# Patient Record
Sex: Female | Born: 1975 | Race: White | Hispanic: No | Smoking: Former smoker
Health system: Southern US, Community
[De-identification: ages and names within clinical notes are randomized; demographics above are authoritative.]

## PROBLEM LIST (undated history)

## (undated) DIAGNOSIS — B009 Herpesviral infection, unspecified: Secondary | ICD-10-CM

## (undated) HISTORY — PX: ANTERIOR CRUCIATE LIGAMENT REPAIR: SHX115

## (undated) HISTORY — PX: OTHER SURGICAL HISTORY: SHX169

## (undated) HISTORY — PX: AUGMENTATION MAMMAPLASTY: SUR837

---

## 2001-07-23 ENCOUNTER — Ambulatory Visit (HOSPITAL_COMMUNITY): Admission: AD | Admit: 2001-07-23 | Discharge: 2001-07-23 | Payer: Self-pay | Admitting: Obstetrics and Gynecology

## 2002-01-08 ENCOUNTER — Other Ambulatory Visit: Admission: RE | Admit: 2002-01-08 | Discharge: 2002-01-08 | Payer: Self-pay | Admitting: Obstetrics and Gynecology

## 2002-04-05 ENCOUNTER — Ambulatory Visit: Admission: RE | Admit: 2002-04-05 | Discharge: 2002-04-05 | Payer: Self-pay | Admitting: Obstetrics and Gynecology

## 2002-06-16 ENCOUNTER — Inpatient Hospital Stay (HOSPITAL_COMMUNITY): Admission: AD | Admit: 2002-06-16 | Discharge: 2002-06-20 | Payer: Self-pay | Admitting: Obstetrics and Gynecology

## 2002-07-19 ENCOUNTER — Other Ambulatory Visit: Admission: RE | Admit: 2002-07-19 | Discharge: 2002-07-19 | Payer: Self-pay | Admitting: Obstetrics and Gynecology

## 2003-08-16 ENCOUNTER — Other Ambulatory Visit: Admission: RE | Admit: 2003-08-16 | Discharge: 2003-08-16 | Payer: Self-pay | Admitting: Obstetrics and Gynecology

## 2004-09-24 ENCOUNTER — Other Ambulatory Visit: Admission: RE | Admit: 2004-09-24 | Discharge: 2004-09-24 | Payer: Self-pay | Admitting: Obstetrics and Gynecology

## 2005-12-02 ENCOUNTER — Inpatient Hospital Stay (HOSPITAL_COMMUNITY): Admission: AD | Admit: 2005-12-02 | Discharge: 2005-12-03 | Payer: Self-pay | Admitting: Obstetrics and Gynecology

## 2006-01-03 ENCOUNTER — Other Ambulatory Visit: Admission: RE | Admit: 2006-01-03 | Discharge: 2006-01-03 | Payer: Self-pay | Admitting: Obstetrics and Gynecology

## 2006-05-01 ENCOUNTER — Ambulatory Visit (HOSPITAL_COMMUNITY): Admission: RE | Admit: 2006-05-01 | Discharge: 2006-05-01 | Payer: Self-pay | Admitting: Obstetrics and Gynecology

## 2006-07-17 ENCOUNTER — Inpatient Hospital Stay (HOSPITAL_COMMUNITY): Admission: AD | Admit: 2006-07-17 | Discharge: 2006-07-20 | Payer: Self-pay | Admitting: Obstetrics and Gynecology

## 2012-02-06 ENCOUNTER — Encounter (HOSPITAL_COMMUNITY): Payer: Self-pay | Admitting: Emergency Medicine

## 2012-02-06 ENCOUNTER — Emergency Department (HOSPITAL_COMMUNITY)
Admission: EM | Admit: 2012-02-06 | Discharge: 2012-02-06 | Disposition: A | Discharge status: Home / Self Care | Payer: BC Managed Care – PPO | Attending: Emergency Medicine | Admitting: Emergency Medicine

## 2012-02-06 ENCOUNTER — Emergency Department (HOSPITAL_COMMUNITY): Payer: BC Managed Care – PPO

## 2012-02-06 DIAGNOSIS — R10819 Abdominal tenderness, unspecified site: Secondary | ICD-10-CM | POA: Insufficient documentation

## 2012-02-06 DIAGNOSIS — N83209 Unspecified ovarian cyst, unspecified side: Secondary | ICD-10-CM | POA: Insufficient documentation

## 2012-02-06 DIAGNOSIS — R109 Unspecified abdominal pain: Secondary | ICD-10-CM | POA: Insufficient documentation

## 2012-02-06 DIAGNOSIS — R110 Nausea: Secondary | ICD-10-CM | POA: Insufficient documentation

## 2012-02-06 HISTORY — DX: Herpesviral infection, unspecified: B00.9

## 2012-02-06 LAB — BASIC METABOLIC PANEL
BUN: 14 mg/dL (ref 6–23)
CO2: 28 mEq/L (ref 19–32)
Calcium: 9.1 mg/dL (ref 8.4–10.5)
Chloride: 100 mEq/L (ref 96–112)
Creatinine, Ser: 0.81 mg/dL (ref 0.50–1.10)
GFR calc Af Amer: >90 mL/min (ref >90)
GFR calc non Af Amer: >90 mL/min (ref >90)
Glucose, Bld: 94 mg/dL (ref 70–99)
Potassium: 3.5 mEq/L (ref 3.5–5.1)
Sodium: 135 mEq/L (ref 135–145)

## 2012-02-06 LAB — URINALYSIS, ROUTINE W REFLEX MICROSCOPIC
Glucose, UA: NEGATIVE mg/dL
Hgb urine dipstick: NEGATIVE
Leukocytes, UA: NEGATIVE
Nitrite: NEGATIVE
Protein, ur: NEGATIVE mg/dL
Specific Gravity, Urine: 1.033 — ABNORMAL HIGH (ref 1.005–1.030)
Urobilinogen, UA: 0.2 mg/dL (ref 0.0–1.0)
pH: 5.5 (ref 5.0–8.0)

## 2012-02-06 LAB — CBC
HCT: 41.8 % (ref 36.0–46.0)
Hemoglobin: 14.5 g/dL (ref 12.0–15.0)
MCH: 30.2 pg (ref 26.0–34.0)
MCHC: 34.7 g/dL (ref 30.0–36.0)
MCV: 87.1 fL (ref 78.0–100.0)
Platelets: 210 10*3/uL (ref 150–400)
RBC: 4.8 MIL/uL (ref 3.87–5.11)
RDW: 12.5 % (ref 11.5–15.5)
WBC: 10 10*3/uL (ref 4.0–10.5)

## 2012-02-06 LAB — PREGNANCY, URINE: Preg Test, Ur: NEGATIVE

## 2012-02-06 MED ORDER — OXYCODONE-ACETAMINOPHEN 5-325 MG PO TABS: 1.0000 | ORAL_TABLET | ORAL | Status: AC | PRN

## 2012-02-06 MED ORDER — HYDROMORPHONE HCL PF 1 MG/ML IJ SOLN
1.0000 mg | Freq: Once | INTRAMUSCULAR | Status: AC
  Administered 2012-02-06: 1 mg via INTRAVENOUS
  Filled 2012-02-06: qty 1

## 2012-02-06 MED ORDER — IOHEXOL 300 MG/ML  SOLN
100.0000 mL | Freq: Once | INTRAMUSCULAR | Status: AC | PRN
  Administered 2012-02-06: 100 mL via INTRAVENOUS

## 2012-02-06 MED ORDER — SODIUM CHLORIDE 0.9 % IV BOLUS (SEPSIS)
1000.0000 mL | Freq: Once | INTRAVENOUS | Status: AC
  Administered 2012-02-06: 1000 mL via INTRAVENOUS

## 2012-02-06 MED ORDER — ONDANSETRON HCL 4 MG/2ML IJ SOLN
4.0000 mg | Freq: Once | INTRAMUSCULAR | Status: AC
  Administered 2012-02-06: 4 mg via INTRAVENOUS
  Filled 2012-02-06: qty 2

## 2012-02-06 NOTE — ED Notes (Signed)
IV removed from right AC, catheter intact, site asymptomatic

## 2012-02-06 NOTE — ED Notes (Signed)
Report received from [facility].  Pt in CT at this time.

## 2012-02-06 NOTE — ED Notes (Signed)
Pt returned from CT °

## 2012-02-06 NOTE — ED Provider Notes (Addendum)
History    35yF with abdominal pain. Lower abdomen. Acute onset while sleeping tonight. Persistent and worse with movement. Throbbing. No urinary complaints. No unusual vaginal bleeding or discharge. Has IUD. No fever or chills. No n/v. No sick contacts. Felt in usual state of heath prior to going to bed. Has had C-sections otherwise no abdominal or pelvic surgeries. Patient does have a history of genital herpes. Has not noticed any rashes recently.  CSN: [id number]  Arrival date & time 02/06/12  0406   First MD Initiated Contact with Patient 02/06/12 [phone]      Chief Complaint  Patient presents with  . Abdominal Pain    (Consider location/radiation/quality/duration/timing/severity/associated sxs/prior treatment) HPI  History reviewed. No pertinent past medical history.  Past Surgical History  Procedure Date  . C sections     Family History  Problem Relation Age of Onset  . Hypertension Other   . Coronary artery disease Other     History  Substance Use Topics  . Smoking status: Never Smoker   . Smokeless tobacco: Not on file  . Alcohol Use: No    OB History    Grav Para Term Preterm Abortions TAB SAB Ect Mult Living                  Review of Systems   Review of symptoms negative unless otherwise noted in HPI.    Allergies  Review of patient's allergies indicates no known allergies.  Home Medications   Current Outpatient Rx  Name Route Sig Dispense Refill  . SERTRALINE HCL 100 MG PO TABS Oral Take 100 mg by mouth daily.      BP 108/74  Pulse 80  Temp(Src) 98.4 F (36.9 C) (Oral)  Resp 20  SpO2 100%  Physical Exam  Nursing note and vitals reviewed. Constitutional: She appears well-developed and well-nourished. No distress.  HENT:  Head: Normocephalic and atraumatic.  Eyes: Conjunctivae are normal. Right eye exhibits no discharge. Left eye exhibits no discharge.  Neck: Neck supple.  Cardiovascular: Normal rate, regular rhythm and normal heart  sounds.  Exam reveals no gallop and no friction rub.   No murmur heard. Pulmonary/Chest: Effort normal and breath sounds normal. No respiratory distress.  Abdominal: Soft. She exhibits no distension. There is tenderness.       Tenderness across lower abdomen. Voluntary guarding with deep palpation. No rebound.   Genitourinary:       No cva tenderness  Musculoskeletal: She exhibits no edema and no tenderness.  Neurological: She is alert.  Skin: Skin is warm and dry. She is not diaphoretic.  Psychiatric: She has a normal mood and affect. Her behavior is normal. Thought content normal.    ED Course  Procedures (including critical care time)  Labs Reviewed  URINALYSIS, ROUTINE W REFLEX MICROSCOPIC - Abnormal; Notable for the following:    Specific Gravity, Urine 1.033 (*)    Bilirubin Urine SMALL (*)    Ketones, ur TRACE (*)    All other components within normal limits  PREGNANCY, URINE  BASIC METABOLIC PANEL  CBC  LAB REPORT - SCANNED   No results found.  Ct Abdomen Pelvis W Contrast  02/06/2012  *RADIOLOGY REPORT*  Clinical Data: Lower abdominal pain, nausea  CT ABDOMEN AND PELVIS WITH CONTRAST  Technique:  Multidetector CT imaging of the abdomen and pelvis was performed following the standard protocol during bolus administration of intravenous contrast.  Contrast: OMNIPAQUE IOHEXOL 300 MG/ML IV SOLN  Comparison: None.  Findings: Lung  bases are unremarkable.  Enhanced liver, spleen, pancreas and adrenal glands are unremarkable.  No calcified gallstones are noted within gallbladder.  Sagittal images of the lumbar spine are unremarkable.  The abdominal aorta is unremarkable.  The enhanced kidneys are symmetrical in size.  No hydronephrosis or hydroureter.  Moderate stool noted in the right colon and cecum. No pericecal inflammation.  The appendix is not identified.  No small bowel obstruction.  No free air is noted within abdomen or pelvis.  No abdominal ascites.  IUD noted within  pelvis . Right ovary is unremarkable.  There is a somewhat flattened elongated cyst within the left ovary measures 3.3 cm.  Small amount of free fluid noted adjacent to left ovary and within the posterior cul-de-sac.  This is suspicious for recent ruptured ovarian cyst.  Further evaluation with pelvic ultrasound could be performed.  No destructive bony lesions are noted within pelvis.  No inguinal adenopathy.  IMPRESSION:  1.  No pericecal inflammation.  The appendix is not identified. 2.  No hydronephrosis or hydroureter. 3.  There is  somewhat flattened elongated cyst within the left ovary measures 3.3 cm.  Small amount of free fluid noted adjacent to left ovary and within the posterior cul-de-sac.  This is suspicious for recent ruptured ovarian cyst.  Further evaluation with pelvic ultrasound could be performed. 4.  IUD in place.  Original Report Authenticated By: Natasha Mead, M.D.    1. Abdominal pain   2. Ovarian cyst       MDM  36 year old female with abdominal pain suspect this is likely secondary to arrange this. CT abdomen and pelvis otherwise shows no acute abnormality. UA is not suggestive of infection. CBC and BMP normal. Patient reports improved pain. Return precautions were discussed. Outpatient followup.        Raeford Razor, MD 02/13/12 0454  Raeford Razor, MD 02/13/12 0800  Raeford Razor, MD 02/13/12 [phone]

## 2012-02-06 NOTE — ED Notes (Signed)
Pt states she is having pain in her lower abd that started last night  Pt states the pain woke her up out of her sleep a couple of times  Pt states she would get sweaty and she felt like she was going to pass out  Pt describes the pain as throbbing in her lower abdomen

## 2012-02-06 NOTE — ED Notes (Signed)
Patient transported to CT 

## 2012-02-06 NOTE — ED Notes (Addendum)
Pt presents with a c/c of lower abdominal pain started a couple of days ago, getting worse tonight.  The pain is just below the umbilicus, in the pubic area.  Pt complains of nausea, no vomiting or diarrhea. No chest pain.  Complains of some SOB, resp effort is wnl, breath sounds clear and equal bilaterally.  Pt has not been around anyone that has been sick lately.

## 2012-02-27 ENCOUNTER — Other Ambulatory Visit: Payer: Self-pay | Admitting: Obstetrics and Gynecology

## 2012-02-27 DIAGNOSIS — N644 Mastodynia: Secondary | ICD-10-CM

## 2012-03-06 ENCOUNTER — Ambulatory Visit
Admission: RE | Admit: 2012-03-06 | Discharge: 2012-03-06 | Disposition: A | Discharge status: Home / Self Care | Payer: BC Managed Care – PPO | Source: Ambulatory Visit | Attending: Obstetrics and Gynecology | Admitting: Obstetrics and Gynecology

## 2012-03-06 DIAGNOSIS — N644 Mastodynia: Secondary | ICD-10-CM

## 2012-03-25 ENCOUNTER — Ambulatory Visit (INDEPENDENT_AMBULATORY_CARE_PROVIDER_SITE_OTHER): Payer: BC Managed Care – PPO | Admitting: General Surgery

## 2012-04-07 ENCOUNTER — Ambulatory Visit (INDEPENDENT_AMBULATORY_CARE_PROVIDER_SITE_OTHER): Payer: BC Managed Care – PPO | Admitting: Surgery

## 2012-04-10 ENCOUNTER — Ambulatory Visit (INDEPENDENT_AMBULATORY_CARE_PROVIDER_SITE_OTHER): Payer: BC Managed Care – PPO | Admitting: General Surgery

## 2012-04-17 ENCOUNTER — Encounter (INDEPENDENT_AMBULATORY_CARE_PROVIDER_SITE_OTHER): Payer: Self-pay | Admitting: General Surgery

## 2012-07-31 ENCOUNTER — Encounter (INDEPENDENT_AMBULATORY_CARE_PROVIDER_SITE_OTHER): Payer: Self-pay | Admitting: General Surgery

## 2012-07-31 ENCOUNTER — Ambulatory Visit (INDEPENDENT_AMBULATORY_CARE_PROVIDER_SITE_OTHER): Payer: BC Managed Care – PPO | Admitting: General Surgery

## 2012-07-31 VITALS — BP 128/84 | HR 68 | Temp 97.6°F | Resp 16 | Ht 69.0 in | Wt 185.2 lb

## 2012-07-31 DIAGNOSIS — K649 Unspecified hemorrhoids: Secondary | ICD-10-CM

## 2012-07-31 NOTE — Patient Instructions (Signed)
Increase fiber to 20-30 grams per day. Increase water intake. Follow up in 4-5 weeks if no improvement.

## 2012-07-31 NOTE — Progress Notes (Signed)
Patient ID: Olivia Allen, female   DOB: 1976-05-28, 35 y.o.   MRN: [id number]  No chief complaint on file.   HPI Olivia Allen is a 36 y.o. female.  This patient was referred by Dr. Henderson Cloud for evaluation of anal skin tags. She says that for about one year now she has had some extra skin in her perianal area that she thinks is associated with hemorrhoids. She says that her main complaint is difficulty with hygiene she has a difficult time wiping clean and she continued to wipe until she actually has some bright blood on the tissue. This does cause some discomfort. She says that she has been trying to increase the fiber in her diet because she has intermittent constipation and diarrhea she says she is "never normal" HPI  Past Medical History  Diagnosis Date  . Herpes     Past Surgical History  Procedure Date  . C sections   . Anterior cruciate ligament repair     Family History  Problem Relation Age of Onset  . Hypertension Other   . Coronary artery disease Other     Social History History  Substance Use Topics  . Smoking status: Never Smoker   . Smokeless tobacco: Not on file  . Alcohol Use: No    No Known Allergies  Current Outpatient Prescriptions  Medication Sig Dispense Refill  . sertraline (ZOLOFT) 50 MG tablet Take 50 mg by mouth daily.        Review of Systems Review of Systems All other review of systems negative or noncontributory except as stated in the HPI  Blood pressure 128/84, pulse 68, temperature 97.6 F (36.4 C), temperature source Temporal, resp. rate 16, height 5\' 9"  (1.753 m), weight 185 lb 3.2 oz (84.006 kg).  Physical Exam Physical Exam Physical Exam  Nursing note and vitals reviewed. Constitutional: She is oriented to person, place, and time. She appears well-developed and well-nourished. No distress.  HENT:  Head: Normocephalic and atraumatic.  Mouth/Throat: No oropharyngeal exudate.  Eyes: Conjunctivae and EOM are normal. Pupils  are equal, round, and reactive to light. Right eye exhibits no discharge. Left eye exhibits no discharge. No scleral icterus.  Neck: Normal range of motion. Neck supple. No tracheal deviation present.  Cardiovascular: Normal rate, regular rhythm, normal heart sounds and intact distal pulses.   Pulmonary/Chest: Effort normal and breath sounds normal. No stridor. No respiratory distress. She has no wheezes.  Abdominal: Soft. Bowel sounds are normal. She exhibits no distension and no mass. There is no tenderness. There is no rebound and no guarding.  Musculoskeletal: Normal range of motion. She exhibits no edema and no tenderness.  Neurological: She is alert and oriented to person, place, and time.  Skin: Skin is warm and dry. She has some abrasions on her right shoulder and arms . She is not diaphoretic. No erythema. No pallor.  Psychiatric: She has a normal mood and affect. Her behavior is normal. Judgment and thought content normal.  Rectal: she has some excess skin and slight amount of prolapsed hemorrhoids circumferentially, no thrombosis or tenderness, no evidence of bleeding, no internal masses.  Data Reviewed   Assessment    Hemorrhoids and anal skin tags She has circumferential hemorrhoids and a small amount of noninflamed prolapsed skin.  This does seem to be causing some hygeine issues and some discomfort.  I think that she may require surgery for treatment and symptomatic relief but I would recommend some continued conservative management first.  I  have recommended increasing fiber to 20-30gms/day of fiber and increasing water intake and use of baby wipes for cleanliness.  If she does these efforts and she is still symptomatic in another 4 weeks, then she will call me back and we will set her up for surgical management which would likely require PPH for treatment of her circumferential disease.    Plan    We will see how this conservative management helped her symptoms and if she still  has persistent symptoms and another couple weeks, we will see her back to discuss surgical management likely with stapled hemorrhoidectomy given her circumferential disease.       Olivia Allen 07/31/2012, 11:55 AM

## 2012-09-19 IMAGING — MG MM DIGITAL DIAGNOSTIC BILAT
2 series · 2 of 2 positions shown · non-contrast
Comparison: None.

CLINICAL DATA: The patient has been experiencing diffuse left
breast pain for 4 weeks.  She states the pain is throbbing in
character, starts between the breasts and radiates throughout the
left breast and occasionally into the right breast.

DIGITAL DIAGNOSTIC BILATERAL MAMMOGRAM WITH CAD

[L MLO]
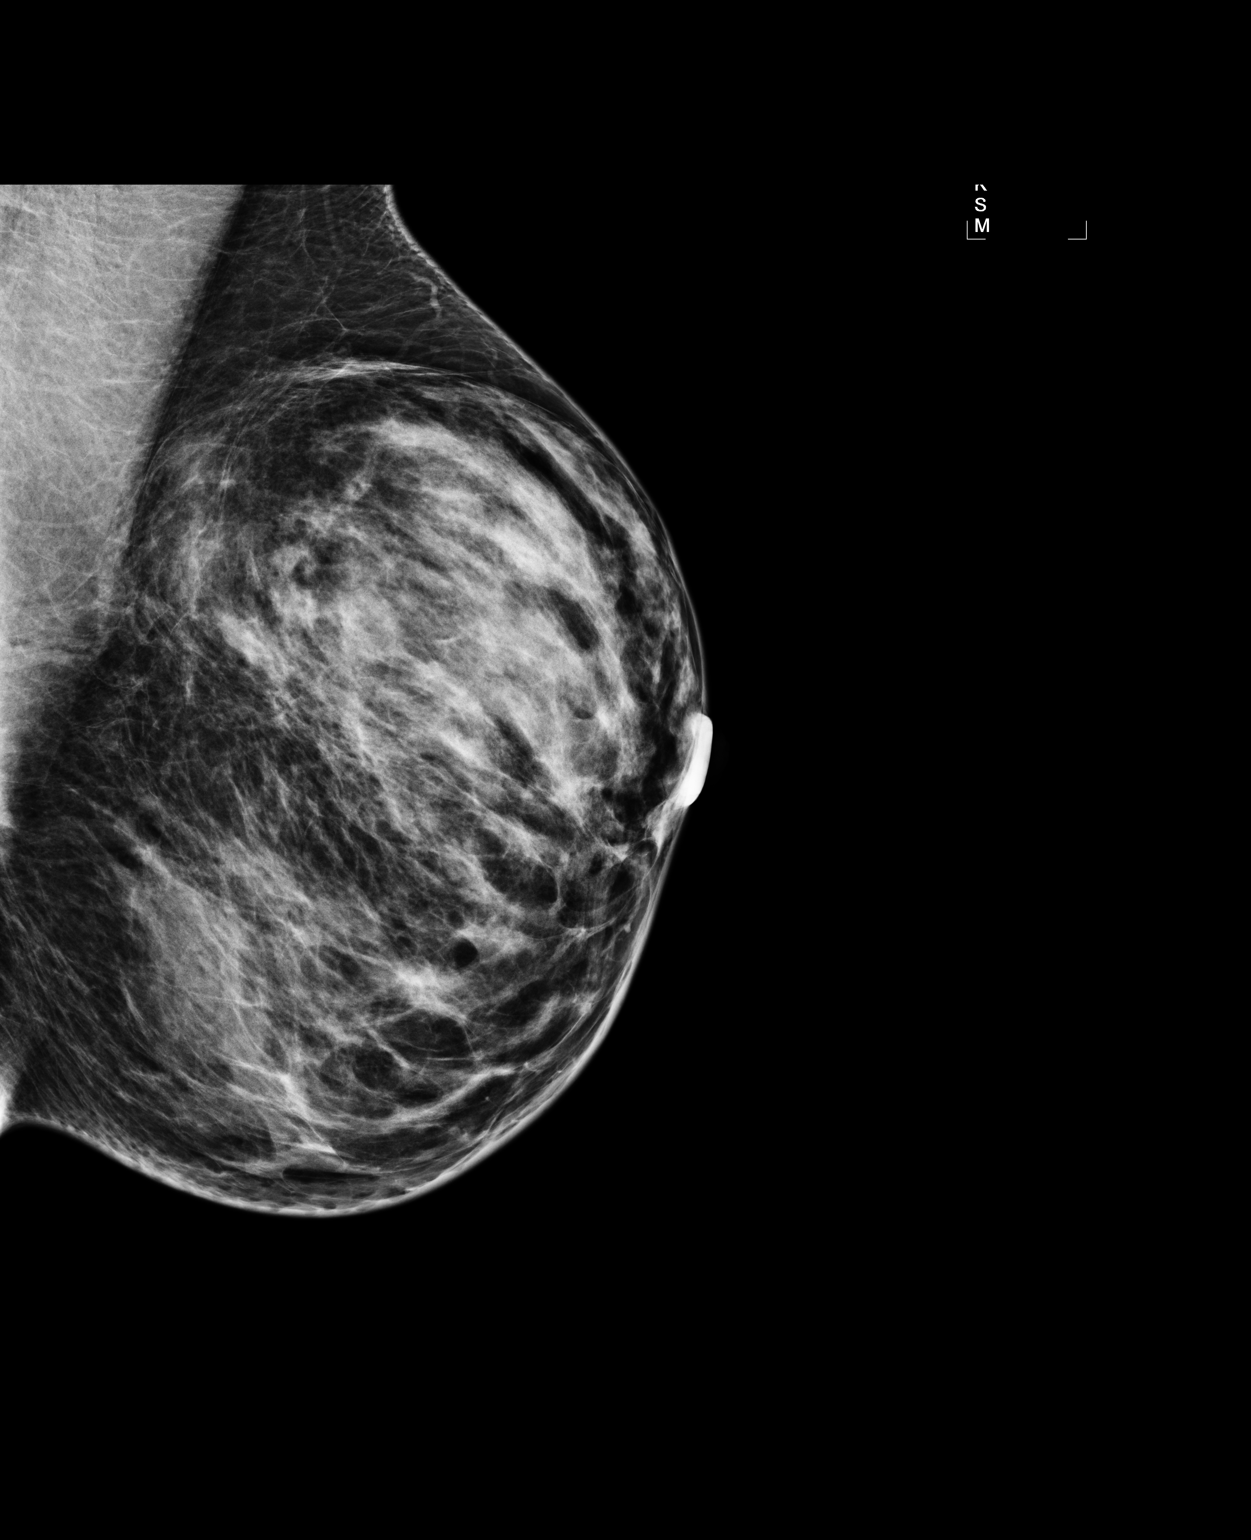

[R MLO]
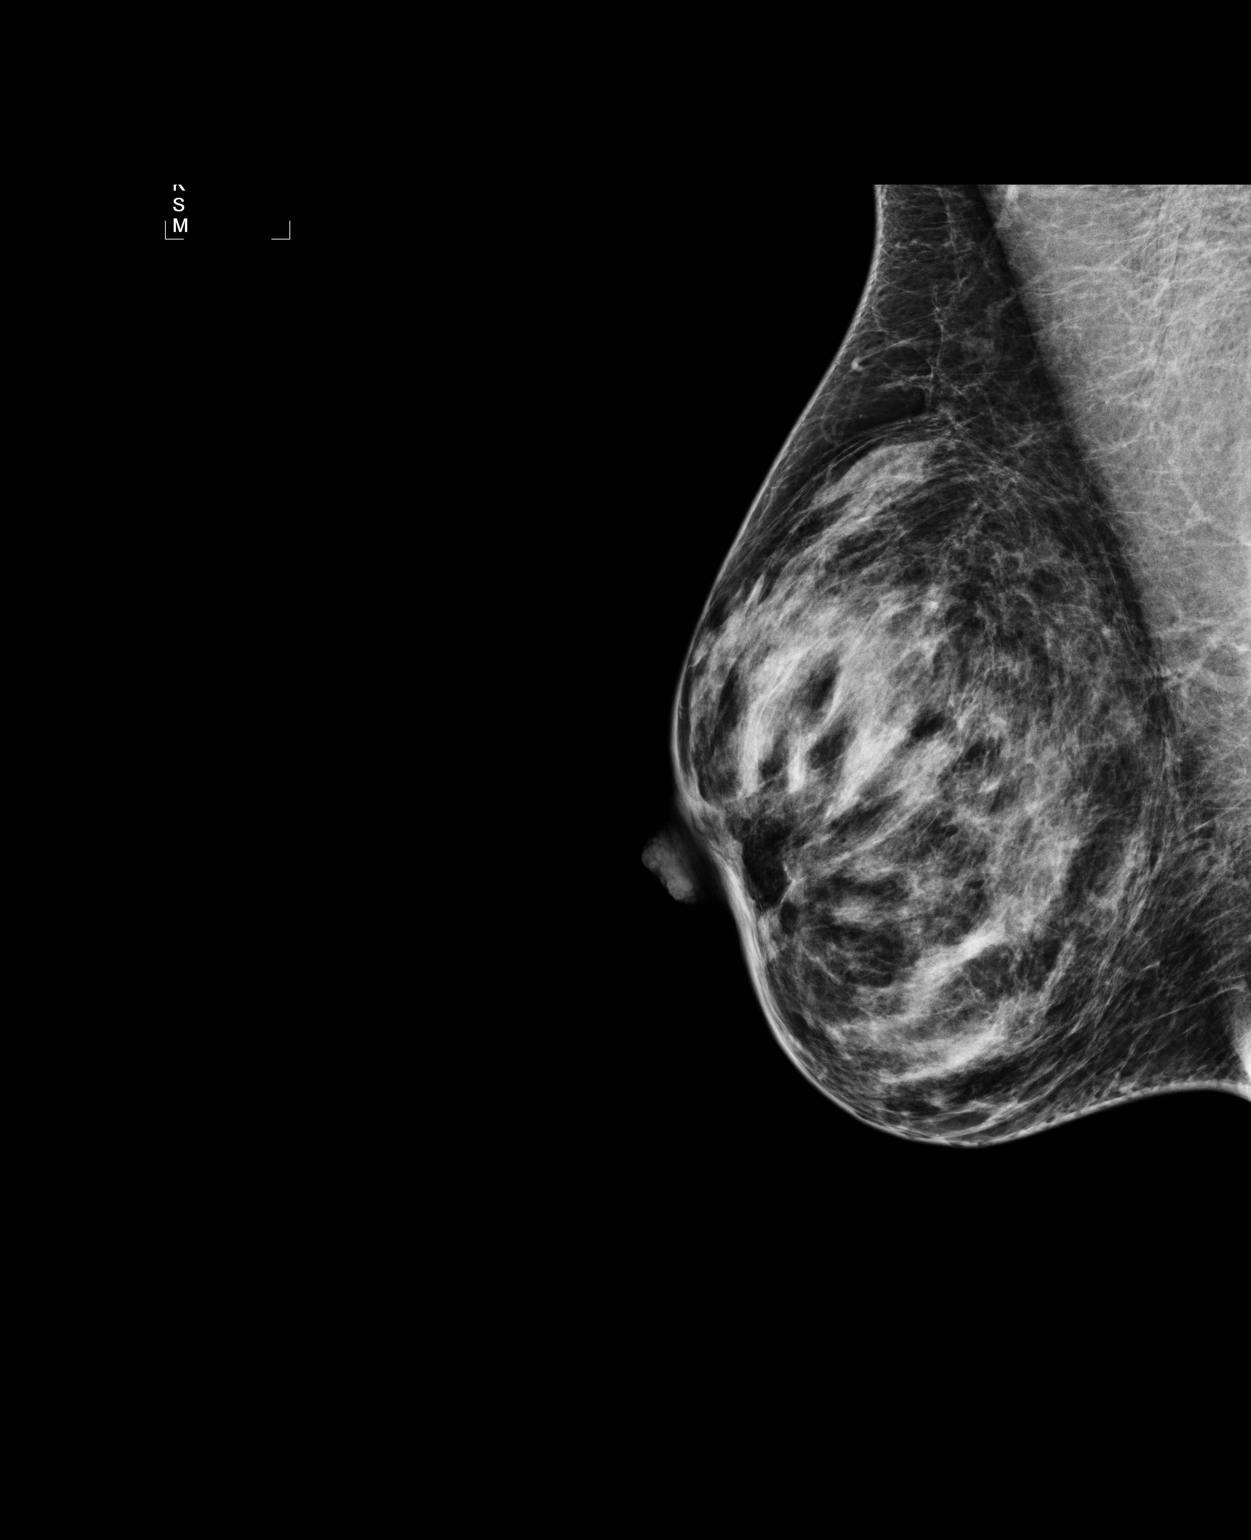

[2 of 2 positions shown; findings below may reference images not displayed]

FINDINGS: The breast tissue is heterogeneously dense.  There is no
dominant mass, architectural distortion or calcification to suggest
malignancy.
Mammographic images were processed with CAD.
IMPRESSION: No mammographic evidence of malignancy.  Yearly screening
mammography should begin at age 40 unless clinically indicated
earlier.

BI-RADS CATEGORY 1:  Negative.

## 2021-07-21 ENCOUNTER — Emergency Department (HOSPITAL_BASED_OUTPATIENT_CLINIC_OR_DEPARTMENT_OTHER)
Admission: EM | Admit: 2021-07-21 | Discharge: 2021-07-21 | Disposition: A | Discharge status: Home / Self Care | Payer: Self-pay | Attending: Emergency Medicine | Admitting: Emergency Medicine

## 2021-07-21 ENCOUNTER — Encounter (HOSPITAL_BASED_OUTPATIENT_CLINIC_OR_DEPARTMENT_OTHER): Payer: Self-pay

## 2021-07-21 ENCOUNTER — Ambulatory Visit (HOSPITAL_BASED_OUTPATIENT_CLINIC_OR_DEPARTMENT_OTHER)
Admission: RE | Admit: 2021-07-21 | Discharge: 2021-07-21 | Disposition: A | Discharge status: Home / Self Care | Payer: Self-pay | Source: Ambulatory Visit | Attending: Emergency Medicine | Admitting: Emergency Medicine

## 2021-07-21 ENCOUNTER — Other Ambulatory Visit: Payer: Self-pay

## 2021-07-21 DIAGNOSIS — R002 Palpitations: Secondary | ICD-10-CM

## 2021-07-21 DIAGNOSIS — M79604 Pain in right leg: Secondary | ICD-10-CM | POA: Insufficient documentation

## 2021-07-21 DIAGNOSIS — M79661 Pain in right lower leg: Secondary | ICD-10-CM

## 2021-07-21 NOTE — ED Notes (Signed)
Pt verbalizes understanding of discharge instructions. Opportunity for questioning and answers were provided. Armand removed by staff, pt discharged from ED to home. Scheduled [place].

## 2021-07-21 NOTE — ED Triage Notes (Addendum)
Pt is present for constant right lower leg pain x one month and occasional "pounding" of the heart that recently started a few days ago. Pt was told by PCP that it may be tendonitis but was told to come to the ER for further evaluation. Denies injury or trauma the leg. Pt is worried about a blood clot. Pt states she has been under a lot of stress lately.

## 2021-07-21 NOTE — ED Provider Notes (Signed)
[facility] Provider Note: Lowella Dell, MD, FACEP  CSN: [id number] MRN: [id number] ARRIVAL: 07/21/21 at 0507 ROOM: DB014/DB014   CHIEF COMPLAINT  Leg Pain   HISTORY OF PRESENT ILLNESS  07/21/21 5:33 AM Olivia Allen is a 45 y.o. female who has had pain in her right lower extremity for about a month.  The pain is located in her right lateral lower leg.  It is throbbing in nature and she rates it as a 7 out of 10, worse with ambulation.  It sometimes radiates to her thigh.  She was seen in an urgent care yesterday and was told it was likely tendinitis but because she has been having palpitations lately (pounding heart, not rapid heart, and she is feeling it pound right now) she is concerned she may have a blood clot.  She is not having chest pain or shortness of breath.  There is no associated swelling.   Past Medical History:  Diagnosis Date   Herpes     Past Surgical History:  Procedure Laterality Date   ANTERIOR CRUCIATE LIGAMENT REPAIR     c sections      Family History  Problem Relation Age of Onset   Hypertension Other    Coronary artery disease Other     Social History   Tobacco Use   Smoking status: Never    Passive exposure: Never   Smokeless tobacco: Never  Substance Use Topics   Alcohol use: No   Drug use: No    Prior to Admission medications   Medication Sig Start Date End Date Taking? Authorizing Provider  sertraline (ZOLOFT) 50 MG tablet Take 50 mg by mouth daily.    [provider]    Allergies Patient has no known allergies.   REVIEW OF SYSTEMS  Negative except as noted here or in the History of Present Illness.   PHYSICAL EXAMINATION  Initial Vital Signs Blood pressure 116/78, pulse 77, temperature 98.1 F (36.7 C), resp. rate 16, height 5\' 9"  (1.753 m), weight 92.5 kg, SpO2 100 %.  Examination General: Well-developed, well-nourished female in no acute distress; appearance consistent with age of record HENT:  normocephalic; atraumatic Eyes: Normal appearance Neck: supple Heart: regular rate and rhythm Lungs: clear to auscultation bilaterally Abdomen: soft; nondistended; nontender; bowel sounds present Extremities: No deformity; full range of motion; pulses normal; mild tenderness of right lateral lower leg Neurologic: Awake, alert and oriented; motor function intact in all extremities and symmetric; no facial droop Skin: Warm and dry Psychiatric: Mildly anxious   RESULTS  Summary of this visit's results, reviewed and interpreted by myself:   EKG Interpretation  Date/Time:  Saturday July 21 2021 05:44:25 EDT Ventricular Rate:  69 PR Interval:  161 QRS Duration: 109 QT Interval:  434 QTC Calculation: 465 R Axis:   63 Text Interpretation: Sinus rhythm Low voltage, precordial leads ST elev, probable normal early repol pattern No previous ECGs available Confirmed by [id number] (Paula Libra) on 07/21/2021 5:48:07 AM       Laboratory Studies: No results found for this or any previous visit (from the past 24 hour(s)). Imaging Studies: No results found.  ED COURSE and MDM  Nursing notes, initial and subsequent vitals signs, including pulse oximetry, reviewed and interpreted by myself.  Vitals:   07/21/21 0516 07/21/21 0517  BP: 116/78   Pulse: 77   Resp: 16   Temp: 98.1 F (36.7 C)   SpO2: 100%   Weight:  92.5 kg  Height:  5\' 9"  (  1.753 m)   Medications - No data to display  We will have the patient return for Doppler ultrasound later today.  A D-dimer is not reliable at this point because have been present long enough that a false negative is possible.  EKG is normal.  PROCEDURES  Procedures   ED DIAGNOSES     ICD-10-CM   1. Pain in right lower leg  M79.661     2. Palpitations with regular cardiac rhythm  R00.2          Alannie Amodio, Jonny Ruiz, MD 07/21/21 [phone]

## 2022-02-03 IMAGING — US US EXTREM LOW VENOUS*R*
1 series · 13 of 24 positions shown · non-contrast
Comparison: None.

CLINICAL DATA: Right lower extremity pain at the level of the calf
and ankle. History of varicose veins.



[Series 1: us venous img lower uni right (dvt) · portal-venous · 13 of 50 slices shown]
[im 1/50]
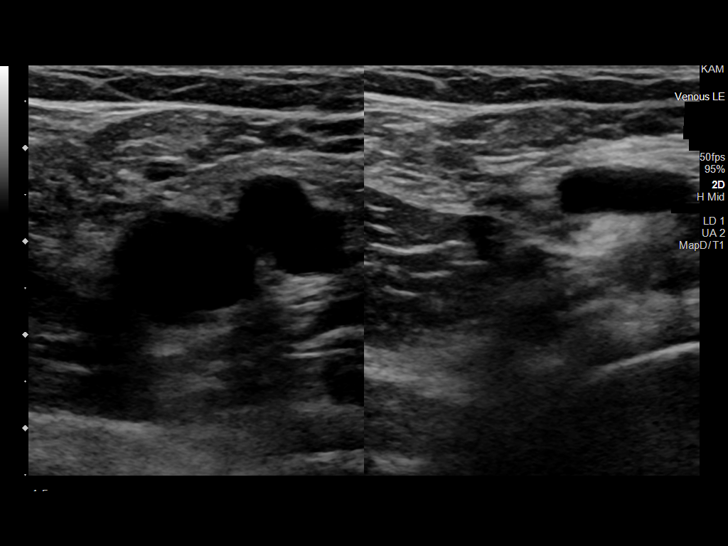
[im 5/50]
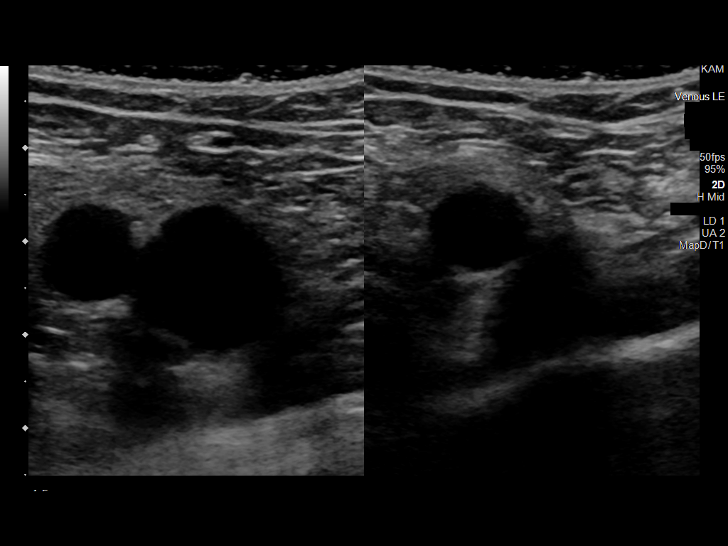
[im 9/50]
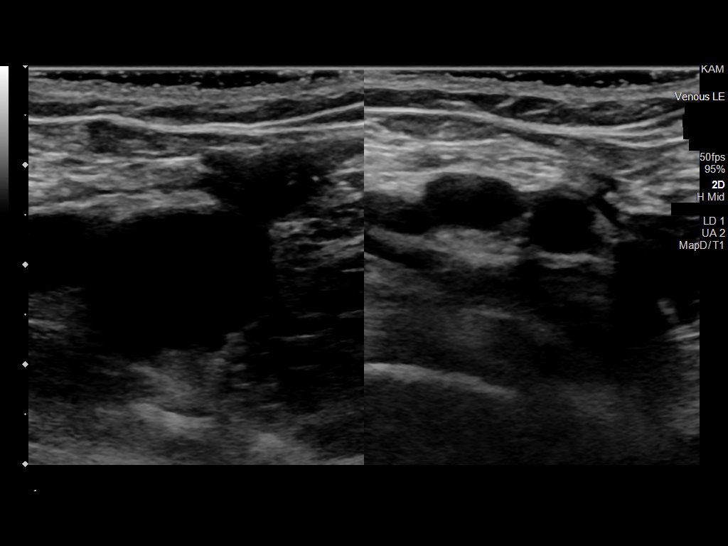
[im 13/50]
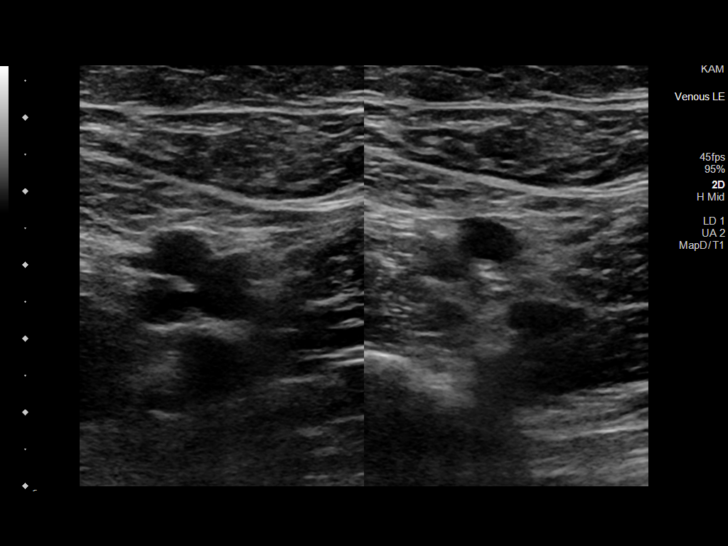
[im 18/50]
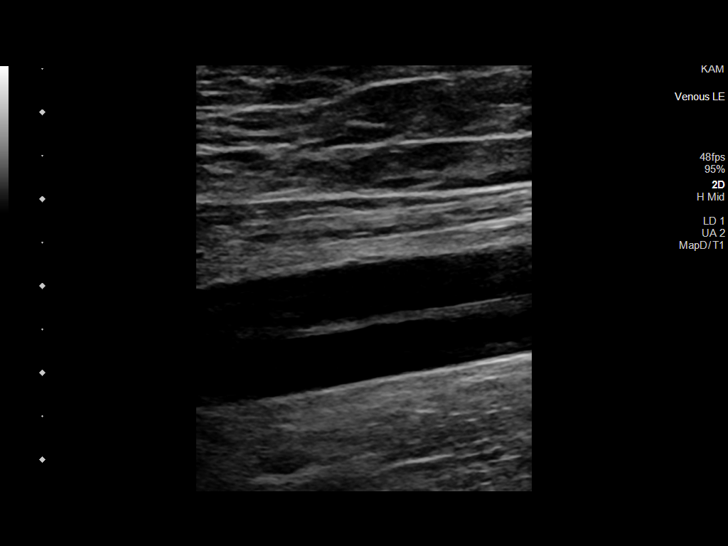
[im 22/50]
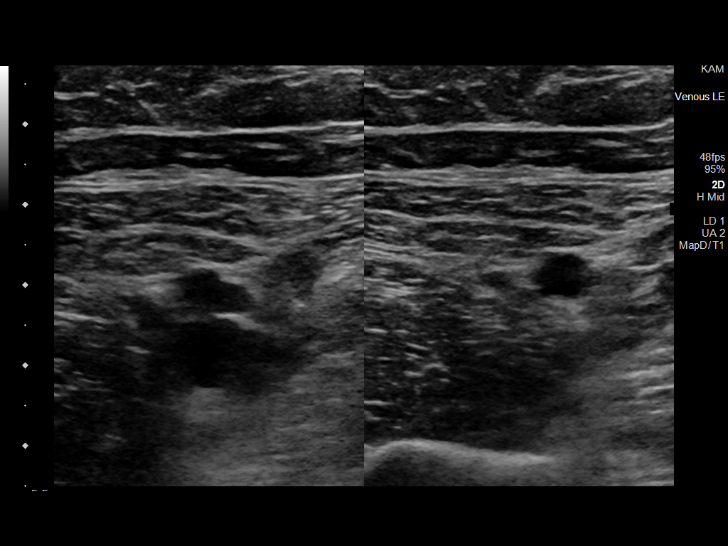
[im 26/50]
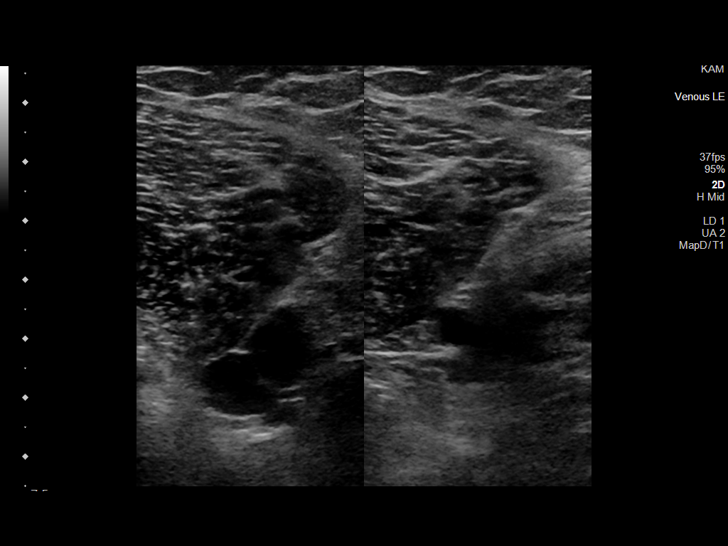
[im 28/50]
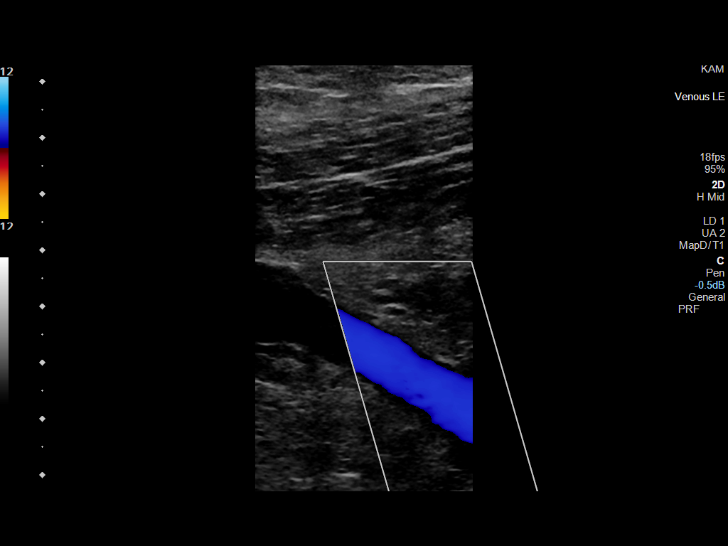
[im 32/50]
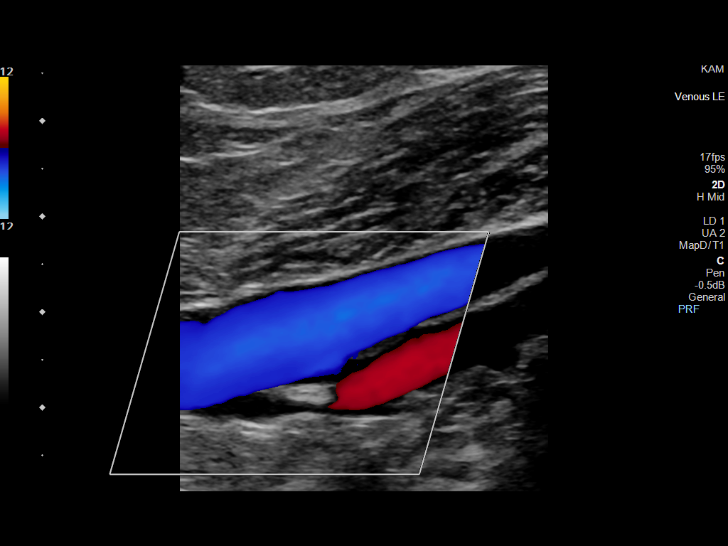
[im 37/50]
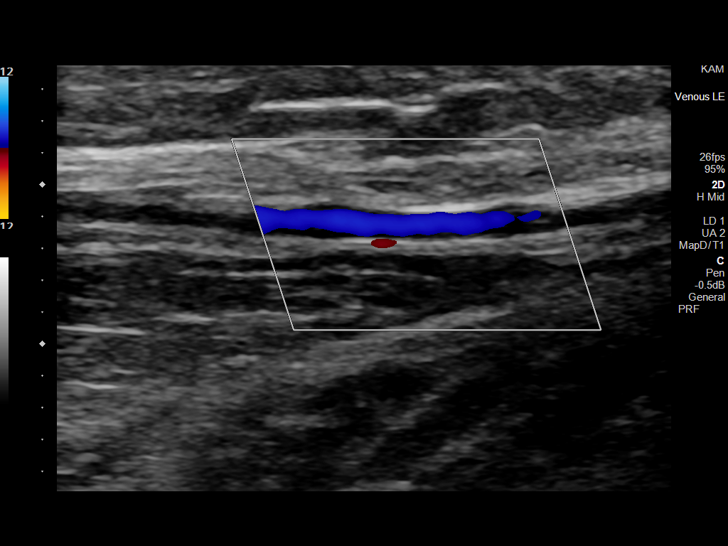
[im 41/50]
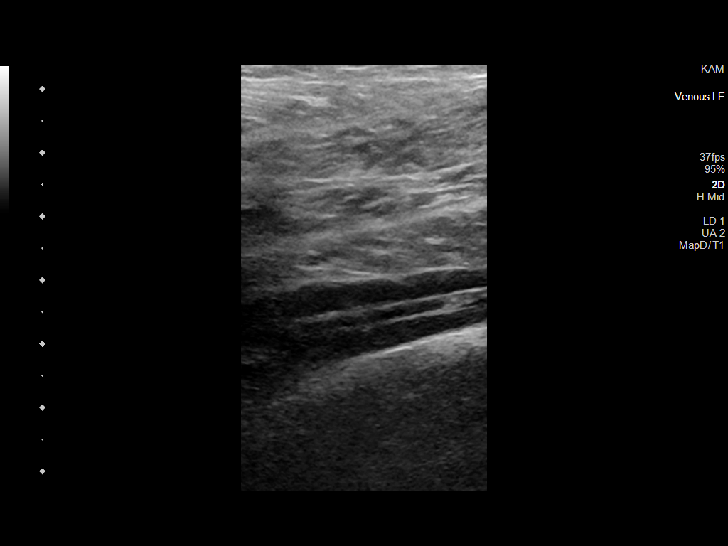
[im 45/50]
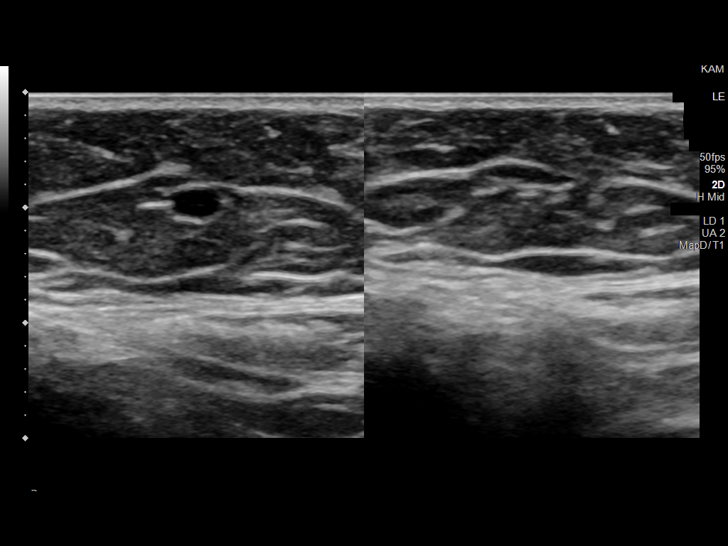
[im 50/50]
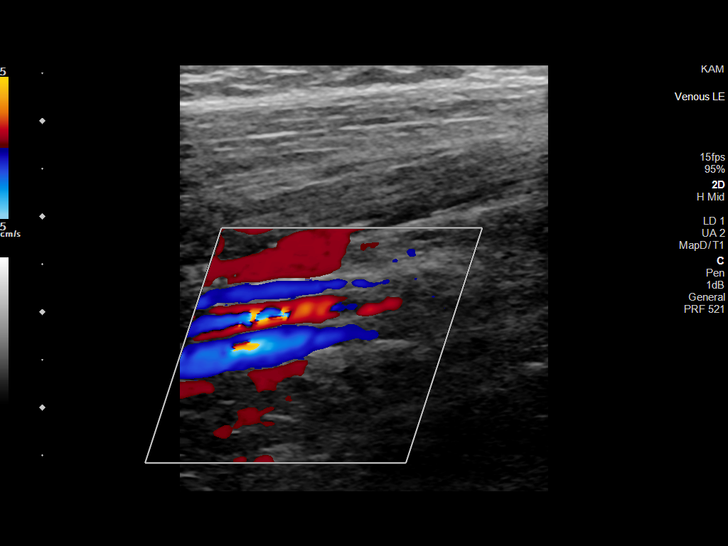

[13 of 24 positions shown; findings below may reference images not displayed]

FINDINGS: Contralateral Common Femoral Vein: Respiratory phasicity is normal
and symmetric with the symptomatic side. No evidence of thrombus.
Normal compressibility.

Common Femoral Vein: No evidence of thrombus. Normal
compressibility, respiratory phasicity and response to augmentation.

Saphenofemoral Junction: No evidence of thrombus. Normal
compressibility and flow on color Doppler imaging.

Profunda Femoral Vein: No evidence of thrombus. Normal
compressibility and flow on color Doppler imaging.

Femoral Vein: No evidence of thrombus. Normal compressibility,
respiratory phasicity and response to augmentation.

Popliteal Vein: No evidence of thrombus. Normal compressibility,
respiratory phasicity and response to augmentation.

Calf Veins: No evidence of thrombus. Normal compressibility and flow
on color Doppler imaging.

Superficial Great Saphenous Vein: No evidence of thrombus. Normal
compressibility.

Venous Reflux:  None.

Other Findings: No evidence of superficial thrombophlebitis or
abnormal fluid collection.
IMPRESSION: No evidence of right lower extremity deep venous thrombosis.

## 2022-02-15 ENCOUNTER — Encounter: Payer: Self-pay | Admitting: Neurology

## 2022-03-18 ENCOUNTER — Other Ambulatory Visit: Payer: Self-pay

## 2022-03-18 DIAGNOSIS — M79604 Pain in right leg: Secondary | ICD-10-CM

## 2022-03-19 ENCOUNTER — Ambulatory Visit: Payer: BC Managed Care – PPO | Admitting: Neurology

## 2022-03-19 ENCOUNTER — Other Ambulatory Visit: Payer: Self-pay

## 2022-03-19 DIAGNOSIS — M79604 Pain in right leg: Secondary | ICD-10-CM | POA: Diagnosis not present

## 2022-03-19 NOTE — Procedures (Signed)
[facility]  ?[address] ? [place], [place] [address] ?Tel: [phone] ?Fax:  [phone] ?Test Date:  03/19/2022 ? ?Patient: Olivia Allen DOB: 1976/07/27 Physician: Nita Sickle, DO  ?Sex: Female Height: 5\' 9"  Ref Phys: , MD  ?ID#: [id number] Pippin   Technician:   ? ?Patient Complaints: ?This is a 46 year old female referred for evaluation of right leg pain. ? ?NCV & EMG Findings: ?Extensive electrodiagnostic testing of the right lower extremity shows: ?Right sural and superficial peroneal sensory responses are within normal limits. ?Right peroneal and tibial motor responses are within normal limits. ?Right tibial H reflex study is within normal limits. ?There is no evidence of active or chronic motor axonal loss changes affecting any of the tested muscles.  Motor unit configuration and recruitment pattern is within normal limits. ? ?Impression: ?This is a normal study of the right lower extremity.  In particular, there is no evidence of a peroneal mononeuropathy, lumbosacral radiculopathy, or sensorimotor polyneuropathy. ? ? ?___________________________ ?59, DO ? ? ? ?Nerve Conduction Studies ?Anti Sensory Summary Table ? ? Stim Site NR Peak (ms) Norm Peak (ms) P-T Amp (?V) Norm P-T Amp  ?Right Sup Peroneal Anti Sensory (Ant Lat Mall)  32?C  ?12 cm    2.2 <4.5 16.3 >5  ?Right Sural Anti Sensory (Lat Mall)  32?C  ?Calf    2.6 <4.5 22.5 >5  ? ?Motor Summary Table ? ? Stim Site NR Onset (ms) Norm Onset (ms) O-P Amp (mV) Norm O-P Amp Site1 Site2 Delta-0 (ms) Dist (cm) Vel (m/s) Norm Vel (m/s)  ?Right Peroneal Motor (Ext Dig Brev)  32?C  ?Ankle    2.3 <5.5 3.2 >3 B Fib Ankle 8.2 37.0 45 >40  ?B Fib    10.5  2.5  Poplt B Fib 1.5 8.0 53 >40  ?Poplt    12.0  2.5         ?Right Peroneal TA Motor (Tib Ant)  32?C  ?Fib Head    3.4 <4.0 4.4 >4 Poplit Fib Head 1.3 7.0 54 >40  ?Poplit    4.7  4.3         ?Right Tibial Motor (Abd Nita Sickle Brev)  32?C  ?Ankle    3.1 <6.0 19.1 >8 Knee  Ankle 8.2 43.0 52 >40  ?Knee    11.3  13.8         ? ?H Reflex Studies ? ? NR H-Lat (ms) Lat Norm (ms) L-R H-Lat (ms)  ?Right Tibial (Gastroc)  32?C  ?   30.61 <35   ? ?EMG ? ? Side Muscle Ins Act Fibs Psw Fasc Number Recrt Dur Dur. Amp Amp. Poly Poly. Comment  ?Right AntTibialis Nml Nml Nml Nml Nml Nml Nml Nml Nml Nml Nml Nml N/A  ?Right Gastroc Nml Nml Nml Nml Nml Nml Nml Nml Nml Nml Nml Nml N/A  ?Right RectFemoris Nml Nml Nml Nml Nml Nml Nml Nml Nml Nml Nml Nml N/A  ?Right Fibularis Long Nml Nml Nml Nml Nml Nml Nml Nml Nml Nml Nml Nml N/A  ?Right BicepsFemS Nml Nml Nml Nml Nml Nml Nml Nml Nml Nml Nml Nml N/A  ? ? ? ? ?Waveforms: ?    ? ?    ? ? ?

## 2022-04-17 HISTORY — PX: KNEE ARTHROSCOPY: SHX127

## 2022-05-09 ENCOUNTER — Ambulatory Visit: Payer: BC Managed Care – PPO | Admitting: Cardiology

## 2022-05-09 ENCOUNTER — Encounter: Payer: Self-pay | Admitting: Cardiology

## 2022-05-09 VITALS — BP 97/61 | HR 76 | Temp 98.0°F | Resp 17 | Ht 69.0 in | Wt 197.4 lb

## 2022-05-09 DIAGNOSIS — R002 Palpitations: Secondary | ICD-10-CM

## 2022-05-09 NOTE — Progress Notes (Signed)
? ?Primary Physician/Referring:  [place], [place] ? ?Patient ID: Olivia Allen, female    DOB: Nov 10, 1976, 46 y.o.   MRN: [id number] ? ?Chief Complaint  ?Patient presents with  ? New Patient (Initial Visit)  ? Palpitations  ? ?HPI:   ? ?Olivia Allen  is a 46 y.o.  with no significant prior cardiovascular history referred to me for evaluation of palpitations that started about 2 months ago, patient has had chronic palpitations but they were very mild.  Most episodes occur at rest and last a few seconds, she exercises regularly and does not feel any symptoms of palpitations.  No family history of sudden cardiac death. ? ?Past Medical History:  ?Diagnosis Date  ? Herpes   ? ?Past Surgical History:  ?Procedure Laterality Date  ? ANTERIOR CRUCIATE LIGAMENT REPAIR    ? c sections    ? KNEE ARTHROSCOPY Right 04/17/2022  ? ?Family History  ?Problem Relation Age of Onset  ? Hypertension Mother   ? Heart disease Paternal Grandmother   ? Heart disease Paternal Grandfather   ? Stroke Paternal Grandfather   ? Hypertension Other   ? Coronary artery disease Other   ?  ?Social History  ? ?Tobacco Use  ? Smoking status: Former  ?  Years: 15.00  ?  Types: Cigarettes  ?  Quit date: 2018  ?  Years since quitting: 5.3  ?  Passive exposure: Never  ? Smokeless tobacco: Never  ? Tobacco comments:  ?  Only smoked on weekends or when going out  ?Substance Use Topics  ? Alcohol use: No  ? ?Marital Status: Divorced  ?ROS  ?Review of Systems  ?Cardiovascular:  Positive for palpitations. Negative for chest pain, dyspnea on exertion and leg swelling.  ?Objective  ?Blood pressure 97/61, pulse 76, temperature 98 ?F (36.7 ?C), temperature source Temporal, resp. rate 17, height 5' 9" (1.753 m), weight 197 lb 6.4 oz (89.5 kg), SpO2 97 %. Body mass index is 29.15 kg/m?.  ? ?  05/09/2022  ?  2:52 PM 07/21/2021  ?  5:45 AM 07/21/2021  ?  5:30 AM  ?Vitals with BMI  ?Height 5' 9"    ?Weight 197 lbs 6 oz    ?BMI 29.14    ?Systolic 97   846  ?Diastolic 61  75  ?Pulse 76 72 70  ?  ? Physical Exam ?Neck:  ?   Vascular: No JVD.  ?Cardiovascular:  ?   Rate and Rhythm: Normal rate and regular rhythm.  ?   Pulses: Intact distal pulses.  ?   Heart sounds: Normal heart sounds. No murmur heard. ?  No gallop.  ?Pulmonary:  ?   Effort: Pulmonary effort is normal.  ?   Breath sounds: Normal breath sounds.  ?Abdominal:  ?   General: Bowel sounds are normal.  ?   Palpations: Abdomen is soft.  ?Musculoskeletal:  ?   Right lower leg: No edema.  ?   Left lower leg: No edema.  ? ? ?Medications and allergies  ?No Known Allergies  ? ?Medication list after today's encounter  ? ?Current Outpatient Medications:  ?  [employer] ASPIRIN ADULT LOW DOSE 81 MG chewable tablet, Chew 81 mg by mouth 2 (two) times daily., Disp: , Rfl:  ?  levonorgestrel (MIRENA, 52 MG,) 20 MCG/DAY IUD, Mirena 21 mcg/24 hours (8 yrs) 52 mg intrauterine device  Take 1 device by intrauterine route., Disp: , Rfl:  ?  meloxicam (MOBIC) 15 MG tablet,  Take 15 mg by mouth daily as needed., Disp: , Rfl:  ?  OZEMPIC, 1 MG/DOSE, 4 MG/3ML SOPN, Inject 1 mg into the skin once a week., Disp: , Rfl:  ? ?Laboratory examination:  ? ?External labs:  ? ?Labs 04/18/2022: ? ?Serum glucose 80 mg, BUN 22, creatinine 0.77, EGFR 97 mL, potassium 3.7.  LFTs normal. ? ?Hb 13.4/HCT 38.2, platelets 230, normal indicis. ? ?Radiology:  ? ? ?Cardiac Studies:  ? ?EKG:  ? ?EKG 05/09/2022: Normal sinus rhythm at rate of 77 bpm, normal axis, incomplete right bundle branch block.  Borderline low voltage complexes.  No change from prior EKG 04/17/2022. ? ?EKG 04/17/2022: Normal sinus rhythm with rate of 71 bpm, normal axis, incomplete right bundle branch block.  Borderline low voltage complexes. ? ?Assessment  ? ?  ICD-10-CM   ?1. Palpitations  R00.2 EKG 12-Lead  ?  ?  ? ?Medications Discontinued During This Encounter  ?Medication Reason  ? sertraline (ZOLOFT) 50 MG tablet Patient Preference  ?  ?No orders of the defined types were placed in  this encounter. ? ?Orders Placed This Encounter  ?Procedures  ? EKG 12-Lead  ? ?Recommendations:  ? ?Olivia Allen is a 46 y.o. with no significant prior cardiovascular history referred to me for evaluation of palpitations that started about 2 months ago, patient has had chronic palpitations but they were very mild.  Most episodes occur at rest and last a few seconds, she exercises regularly and does not feel any symptoms of palpitations.  No family history of sudden cardiac death. ? ?Her physical examination except for being over weight is essentially normal.  Symptoms are classic for occasional PVCs. Palpitations, symptoms suggest PAC/PVC: I have reassured the patient. I will emperically try Vit B1 (Thiamine) 50 mg, Vit B6 (Pyrodoxine) 50 mg and Vit B12 (rappid release) 1041mg, twice daily for palpitations and tachycardia. Consider Fish oil capsule daily with food.  ? ?Her symptoms could be related to lack of sleep, anxiety and stress.  If symptoms persist, we could certainly do extended EKG monitoring for reassurance.  Given normal EKG, normal physical exam and patient states that her TSH was normal recently and she is presently taking Ozempic for weight loss, do not think she needs further cardiac work-up.  I will see her back on a as needed basis. ? ? ? ?JAdrian Prows MD, [facility]?05/09/2022, 3:12 PM ?Office: [phone]?

## 2022-10-01 ENCOUNTER — Other Ambulatory Visit: Payer: Self-pay | Admitting: Obstetrics and Gynecology

## 2022-10-01 DIAGNOSIS — N644 Mastodynia: Secondary | ICD-10-CM

## 2022-10-09 ENCOUNTER — Other Ambulatory Visit: Payer: BC Managed Care – PPO

## 2022-10-18 ENCOUNTER — Ambulatory Visit
Admission: RE | Admit: 2022-10-18 | Discharge: 2022-10-18 | Disposition: A | Discharge status: Home / Self Care | Payer: BC Managed Care – PPO | Source: Ambulatory Visit | Attending: Obstetrics and Gynecology | Admitting: Obstetrics and Gynecology

## 2022-10-18 ENCOUNTER — Ambulatory Visit: Admission: RE | Admit: 2022-10-18 | Payer: BC Managed Care – PPO | Source: Ambulatory Visit

## 2022-10-18 DIAGNOSIS — N644 Mastodynia: Secondary | ICD-10-CM

## 2023-04-24 ENCOUNTER — Other Ambulatory Visit: Payer: Self-pay | Admitting: Obstetrics and Gynecology

## 2023-04-24 DIAGNOSIS — E041 Nontoxic single thyroid nodule: Secondary | ICD-10-CM

## 2023-04-28 ENCOUNTER — Ambulatory Visit
Admission: RE | Admit: 2023-04-28 | Discharge: 2023-04-28 | Disposition: A | Discharge status: Home / Self Care | Payer: BC Managed Care – PPO | Source: Ambulatory Visit | Attending: Obstetrics and Gynecology | Admitting: Obstetrics and Gynecology

## 2023-04-28 DIAGNOSIS — E041 Nontoxic single thyroid nodule: Secondary | ICD-10-CM

## 2023-05-19 ENCOUNTER — Other Ambulatory Visit: Payer: Self-pay | Admitting: Surgery

## 2023-05-19 DIAGNOSIS — E041 Nontoxic single thyroid nodule: Secondary | ICD-10-CM

## 2023-06-23 ENCOUNTER — Other Ambulatory Visit (HOSPITAL_COMMUNITY)
Admission: RE | Admit: 2023-06-23 | Discharge: 2023-06-23 | Disposition: A | Discharge status: Home / Self Care | Payer: BC Managed Care – PPO | Source: Ambulatory Visit | Attending: Interventional Radiology | Admitting: Interventional Radiology

## 2023-06-23 ENCOUNTER — Ambulatory Visit
Admission: RE | Admit: 2023-06-23 | Discharge: 2023-06-23 | Disposition: A | Discharge status: Home / Self Care | Payer: BC Managed Care – PPO | Source: Ambulatory Visit | Attending: Surgery | Admitting: Surgery

## 2023-06-23 DIAGNOSIS — E041 Nontoxic single thyroid nodule: Secondary | ICD-10-CM

## 2023-06-23 DIAGNOSIS — R896 Abnormal cytological findings in specimens from other organs, systems and tissues: Secondary | ICD-10-CM | POA: Insufficient documentation

## 2023-06-25 LAB — CYTOLOGY - NON PAP

## 2023-06-26 NOTE — Progress Notes (Signed)
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  It has been submitted for molecular genetic testing with [facility].  This will take about 3 weeks to result.  We will contact the patient when the results are available.  tmg  Darnell Level, MD [facility] practice Office: [phone]

## 2023-07-07 ENCOUNTER — Encounter (HOSPITAL_COMMUNITY): Payer: Self-pay

## 2023-07-07 NOTE — Progress Notes (Signed)
Good news!  [facility] results are benign.  Will plan a repeat USN and TSH level in one year, followed by an office visit with me for exam and to review the results.  Tresa Endo - please arrange.  tmg  Darnell Level, MD [facility] practice Office: [phone]
